# Patient Record
Sex: Male | Born: 1985 | Race: White | Hispanic: No | Marital: Married | Smoking: Current every day smoker
Health system: Southern US, Community
[De-identification: ages and names within clinical notes are randomized; demographics above are authoritative.]

---

## 2020-01-12 ENCOUNTER — Emergency Department (HOSPITAL_COMMUNITY)
Admission: EM | Admit: 2020-01-12 | Discharge: 2020-01-12 | Discharge status: Against Medical Advice | Payer: Self-pay | Attending: Emergency Medicine | Admitting: Emergency Medicine

## 2020-01-12 ENCOUNTER — Other Ambulatory Visit: Payer: Self-pay

## 2020-01-12 ENCOUNTER — Encounter (HOSPITAL_COMMUNITY): Payer: Self-pay | Admitting: *Deleted

## 2020-01-12 ENCOUNTER — Emergency Department (HOSPITAL_COMMUNITY): Payer: Self-pay

## 2020-01-12 DIAGNOSIS — L0390 Cellulitis, unspecified: Secondary | ICD-10-CM

## 2020-01-12 DIAGNOSIS — F17210 Nicotine dependence, cigarettes, uncomplicated: Secondary | ICD-10-CM | POA: Insufficient documentation

## 2020-01-12 DIAGNOSIS — L03113 Cellulitis of right upper limb: Secondary | ICD-10-CM | POA: Insufficient documentation

## 2020-01-12 DIAGNOSIS — F1910 Other psychoactive substance abuse, uncomplicated: Secondary | ICD-10-CM | POA: Insufficient documentation

## 2020-01-12 LAB — CBC WITH DIFFERENTIAL/PLATELET
Abs Immature Granulocytes: 0.04 10*3/uL (ref 0.00–0.07)
Basophils Absolute: 0 10*3/uL (ref 0.0–0.1)
Basophils Relative: 0 %
Eosinophils Absolute: 0.3 10*3/uL (ref 0.0–0.5)
Eosinophils Relative: 3 %
HCT: 45.8 % (ref 39.0–52.0)
Hemoglobin: 15.3 g/dL (ref 13.0–17.0)
Immature Granulocytes: 0 %
Lymphocytes Relative: 19 %
Lymphs Abs: 2 10*3/uL (ref 0.7–4.0)
MCH: 29.6 pg (ref 26.0–34.0)
MCHC: 33.4 g/dL (ref 30.0–36.0)
MCV: 88.6 fL (ref 80.0–100.0)
Monocytes Absolute: 0.9 10*3/uL (ref 0.1–1.0)
Monocytes Relative: 8 %
Neutro Abs: 7.4 10*3/uL (ref 1.7–7.7)
Neutrophils Relative %: 70 %
Platelets: 184 10*3/uL (ref 150–400)
RBC: 5.17 MIL/uL (ref 4.22–5.81)
RDW: 14 % (ref 11.5–15.5)
WBC: 10.6 10*3/uL — ABNORMAL HIGH (ref 4.0–10.5)
nRBC: 0 % (ref 0.0–0.2)

## 2020-01-12 LAB — BASIC METABOLIC PANEL
Anion gap: 8 (ref 5–15)
BUN: 9 mg/dL (ref 6–20)
CO2: 30 mmol/L (ref 22–32)
Calcium: 8.5 mg/dL — ABNORMAL LOW (ref 8.9–10.3)
Chloride: 95 mmol/L — ABNORMAL LOW (ref 98–111)
Creatinine, Ser: 0.66 mg/dL (ref 0.61–1.24)
GFR calc Af Amer: >60 mL/min (ref >60)
GFR calc non Af Amer: >60 mL/min (ref >60)
Glucose, Bld: 121 mg/dL — ABNORMAL HIGH (ref 70–99)
Potassium: 3.7 mmol/L (ref 3.5–5.1)
Sodium: 133 mmol/L — ABNORMAL LOW (ref 135–145)

## 2020-01-12 MED ORDER — CLINDAMYCIN PHOSPHATE 900 MG/50ML IV SOLN
900.0000 mg | Freq: Once | INTRAVENOUS | Status: AC
  Administered 2020-01-12: 06:00:00 900 mg via INTRAVENOUS
  Filled 2020-01-12: qty 50

## 2020-01-12 MED ORDER — DOXYCYCLINE HYCLATE 100 MG PO CAPS: 100.0000 mg | ORAL_CAPSULE | Freq: Two times a day (BID) | ORAL | 0 refills | Status: DC

## 2020-01-12 MED ORDER — DOXYCYCLINE HYCLATE 100 MG PO CAPS: 100.0000 mg | ORAL_CAPSULE | Freq: Two times a day (BID) | ORAL | 0 refills | Status: AC

## 2020-01-12 MED ORDER — IOHEXOL 300 MG/ML  SOLN
100.0000 mL | Freq: Once | INTRAMUSCULAR | Status: AC | PRN
  Administered 2020-01-12: 100 mL via INTRAVENOUS

## 2020-01-12 MED ORDER — KETOROLAC TROMETHAMINE 30 MG/ML IJ SOLN
30.0000 mg | Freq: Once | INTRAMUSCULAR | Status: AC
  Administered 2020-01-12: 30 mg via INTRAVENOUS
  Filled 2020-01-12: qty 1

## 2020-01-12 NOTE — Discharge Instructions (Addendum)
You are leaving the emergency department AGAINST MEDICAL ADVICE.  We discussed that we are concerned you have a serious infection of the arm but could cause you to lose the arm or die.  You have agreed to come back at a later time today for completion of management.  It is imperative that you do so.

## 2020-01-12 NOTE — ED Provider Notes (Signed)
[facility] EMERGENCY DEPARTMENT Provider Note   CSN: [id number] Arrival date & time: 01/12/20  0436  Time seen 5:00 AM  History Chief Complaint  Patient presents with  . Arm Pain    Luke Chaney is a 34 y.o. male.  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  Patient is right-handed.  He relates about 1 AM yesterday morning he noticed some redness on his right forearm and his girlfriend marked in ink.  He reports increasing redness and swelling and pain in his arm over the past 24 hours.  He denies fever, chills, chest pain, or shortness of breath.  Patient states he went to [facility] last night and had an x-ray done but he left because he had been in the waiting room for 3 hours.  PCP Patient, No Pcp Per   History reviewed. No pertinent past medical history.  There are no problems to display for this patient.   History reviewed. No pertinent surgical history.     History reviewed. No pertinent family history.  Social History   Tobacco Use  . Smoking status: Current Every Day Smoker    Packs/day: 1.00    Types: Cigarettes  . Smokeless tobacco: Never Used  Substance Use Topics  . Alcohol use: Not Currently  . Drug use: Yes    Types: Methamphetamines, Marijuana    Comment: heroin  unemployed  Home Medications Prior to Admission medications   Medication Sig Start Date End Date Taking? Authorizing Provider  doxycycline (VIBRAMYCIN) 100 MG capsule Take 1 capsule (100 mg total) by mouth 2 (two) times daily. 01/12/20   Devoria Albe, MD    Allergies    Patient has no known allergies.  Review of Systems   Review of Systems  All other systems reviewed and are negative.   Physical Exam Updated Vital Signs BP 137/83 (BP Location: Left Arm)   Pulse (!) 115   Temp 99.2 F (37.3 C) (Oral)   Resp 18   Ht 5\' 11"  (1.803 m)   Wt 68  kg   SpO2 100%   BMI 20.92 kg/m   Physical Exam Vitals and nursing note reviewed.  Constitutional:      Appearance: Normal appearance. He is normal weight.  HENT:     Head: Normocephalic and atraumatic.     Right Ear: External ear normal.     Left Ear: External ear normal.     Nose: Nose normal.  Eyes:     Extraocular Movements: Extraocular movements intact.     Conjunctiva/sclera: Conjunctivae normal.     Pupils: Pupils are equal, round, and reactive to light.  Cardiovascular:     Rate and Rhythm: Regular rhythm. Tachycardia present.     Pulses: Normal pulses.     Comments: Loud S2 Pulmonary:     Effort: Pulmonary effort is normal. No respiratory distress.     Breath sounds: Normal breath sounds.  Musculoskeletal:        General: Swelling and tenderness present.     Cervical back: Normal range of motion.     Comments: Patient has a large area of redness and swelling on the volar aspect of his mid right forearm with a lot of tenderness to even light palpation along the scar tissue where he is injected along that vein.  He is noted to have several  red streaking that extends from the elbow superiorly.  There may be some small lymph nodes in the right axilla.  Skin:    General: Skin is warm and dry.     Capillary Refill: Capillary refill takes less than 2 seconds.     Findings: Erythema present.  Neurological:     General: No focal deficit present.     Mental Status: He is alert and oriented to person, place, and time.     Cranial Nerves: No cranial nerve deficit.  Psychiatric:        Mood and Affect: Mood normal.        Behavior: Behavior normal.        Thought Content: Thought content normal.           ED Results / Procedures / Treatments   Labs (all labs ordered are listed, but only abnormal results are displayed) Results for orders placed or performed during the hospital encounter of 01/12/20  Culture, blood (routine x 2)   Specimen: Left Antecubital; Blood    Result Value Ref Range   Specimen Description LEFT ANTECUBITAL    Special Requests      BOTTLES DRAWN AEROBIC AND ANAEROBIC Blood Culture adequate volume Performed at [facility], [address], [place], [place] [address]    Culture PENDING    Report Status PENDING   CBC with Differential  Result Value Ref Range   WBC 10.6 (H) 4.0 - 10.5 K/uL   RBC 5.17 4.22 - 5.81 MIL/uL   Hemoglobin 15.3 13.0 - 17.0 g/dL   HCT 45.8 39.0 - 52.0 %   MCV 88.6 80.0 - 100.0 fL   MCH 29.6 26.0 - 34.0 pg   MCHC 33.4 30.0 - 36.0 g/dL   RDW 14.0 11.5 - 15.5 %   Platelets 184 150 - 400 K/uL   nRBC 0.0 0.0 - 0.2 %   Neutrophils Relative % 70 %   Neutro Abs 7.4 1.7 - 7.7 K/uL   Lymphocytes Relative 19 %   Lymphs Abs 2.0 0.7 - 4.0 K/uL   Monocytes Relative 8 %   Monocytes Absolute 0.9 0.1 - 1.0 K/uL   Eosinophils Relative 3 %   Eosinophils Absolute 0.3 0.0 - 0.5 K/uL   Basophils Relative 0 %   Basophils Absolute 0.0 0.0 - 0.1 K/uL   Immature Granulocytes 0 %   Abs Immature Granulocytes 0.04 0.00 - 0.07 K/uL  Basic metabolic panel  Result Value Ref Range   Sodium 133 (L) 135 - 145 mmol/L   Potassium 3.7 3.5 - 5.1 mmol/L   Chloride 95 (L) 98 - 111 mmol/L   CO2 30 22 - 32 mmol/L   Glucose, Bld 121 (H) 70 - 99 mg/dL   BUN 9 6 - 20 mg/dL   Creatinine, Ser 0.66 0.61 - 1.24 mg/dL   Calcium 8.5 (L) 8.9 - 10.3 mg/dL   GFR calc non Af Amer >60 >60 mL/min   GFR calc Af Amer >60 >60 mL/min   Anion gap 8 5 - 15     Laboratory interpretation all normal except mild leukocytosis, nonfasting hyperglycemia, mild hyponatremia   EKG None  Radiology No results found.   X-[facility] from [facility] from last night     Procedures Procedures (including critical care time)  Medications Ordered in ED Medications  clindamycin (CLEOCIN) IVPB 900 mg (0 mg Intravenous Stopped 01/12/20 0632)  ketorolac (TORADOL) 30 MG/ML injection 30 mg (30 mg Intravenous Given 01/12/20 0609)  iohexol (OMNIPAQUE) 300 MG/ML  solution  100 mL (100 mLs Intravenous Contrast Given 01/12/20 6063)    ED Course  I have reviewed the triage vital signs and the nursing notes.  Pertinent labs & imaging results that were available during my care of the patient were reviewed by me and considered in my medical decision making (see chart for details).    MDM Rules/Calculators/A&P                       Patient has made it clear to me that he cannot stay, he has to be out of his hotel room by tomorrow morning and all the belongings he owns are in there.  He states he has no one to help him.  He did agree to the lab work and he was given IV clindamycin for suspected MRSA cellulitis and possible abscess.  Patient had a CT scan done of his forearm.  Patient left at change of shift with T. Triplett, PA to get results of his CT scan.  Patient may need I&D.  Final Clinical Impression(s) / ED Diagnoses Final diagnoses:  Cellulitis of right forearm  IV drug abuse (HCC)    Rx / DC Orders ED Discharge Orders         Ordered    doxycycline (VIBRAMYCIN) 100 MG capsule  2 times daily,   Status:  Discontinued     01/12/20 0724    doxycycline (VIBRAMYCIN) 100 MG capsule  2 times daily     01/12/20 0725          Plan discharge  Devoria Albe, MD, Concha Pyo, MD 01/12/20 [phone]

## 2020-01-12 NOTE — ED Notes (Signed)
Lab went in to draw 2nd set of blood cultures, pt stated "do yall really need another IV?" this nurse stated were not starting another IV we need more blood cultures and lab work. Pt states " I dont want to be stuck anymore." MD notified

## 2020-01-12 NOTE — ED Provider Notes (Signed)
  Provider Note MRN:  [id number]  Arrival date & time: 01/12/20    ED Course and Medical Decision Making  Assumed care from Dr. Lynelle Doctor at shift change.  IV drug use with arm infection, question abscess or deeper space infection.  Patient explains that he needs to go tend to a personal matter and cannot stay.  Explained that we are concerned for infection and he may become very sick or lose the arm or die.  He promises to return later this afternoon once he manages his affairs.  Received single dose antibiotics here, explained that he needs antibiotics and possibly a procedure as well.  Agrees to come back later.  Patient fully aware of the risks of leaving AGAINST MEDICAL ADVICE.  Procedures  Final Clinical Impressions(s) / ED Diagnoses     ICD-10-CM   1. Cellulitis of right forearm  L03.113   2. Cellulitis, unspecified cellulitis site  L03.90 CT FOREARM RIGHT W CONTRAST    CT FOREARM RIGHT W CONTRAST  3. IV drug abuse (HCC)  F19.10     ED Discharge Orders         Ordered    doxycycline (VIBRAMYCIN) 100 MG capsule  2 times daily,   Status:  Discontinued     01/12/20 0724    doxycycline (VIBRAMYCIN) 100 MG capsule  2 times daily     01/12/20 0725            Discharge Instructions     You are leaving the emergency department AGAINST MEDICAL ADVICE.  We discussed that we are concerned you have a serious infection of the arm but could cause you to lose the arm or die.  You have agreed to come back at a later time today for completion of management.  It is imperative that you do so.    Elmer Sow. Pilar Plate, MD [facility] Emergency [facility] [email or web address]    Sabas Sous, MD 01/12/20 [phone]

## 2020-01-12 NOTE — ED Triage Notes (Signed)
Pt states he was doing a shot of heroin on Monday morning and he missed the vein; pt states his arm starting getting more red and swollen throughout the day; pt states his girlfriend marked around the red area Tuesday at 1am; today he comes in and the area has spread more outside the marked area and is more painful

## 2020-01-17 LAB — CULTURE, BLOOD (ROUTINE X 2)
Culture: NO GROWTH
Special Requests: ADEQUATE

## 2021-03-01 IMAGING — CT CT FOREARM*R* W/CM
3 of 4 series · 7 of 33 positions shown, 9 images · IV contrast (agent unspecified)
Comparison: None.

CONTRAST:  100mL OMNIPAQUE IOHEXOL 300 MG/ML  SOLN

CLINICAL DATA: IV drug abuse. Pain, swelling and redness of the
right upper extremity.

EXAM:
CT OF THE UPPER RIGHT EXTREMITY WITH CONTRAST
TECHNIQUE: Multidetector CT imaging of the upper right extremity was performed
according to the standard protocol following intravenous contrast
administration.

[Series 6: axial st · coronal · 0.32mm/px · 1 of 75 slices shown]
[im 38/75  bone]
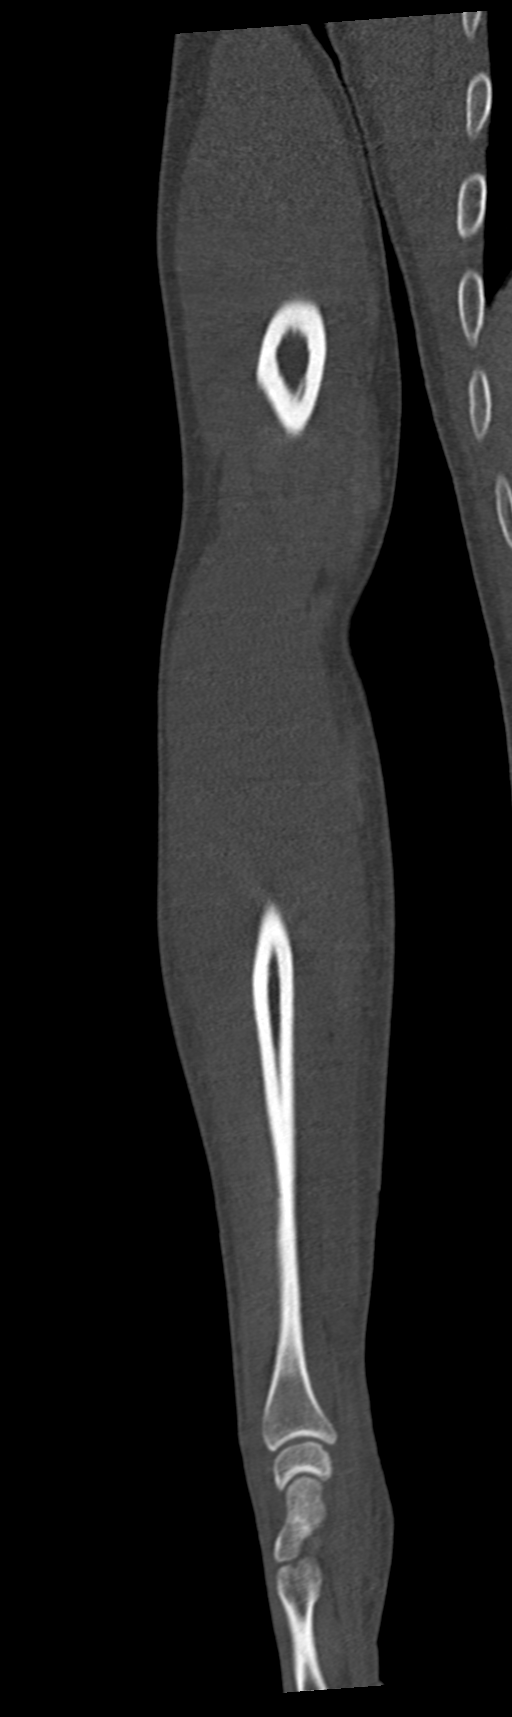

[Series 10: sag st · sagittal · 0.41mm/px · 5 of 97 slices shown, 6 images]
[im 33/97  bone]
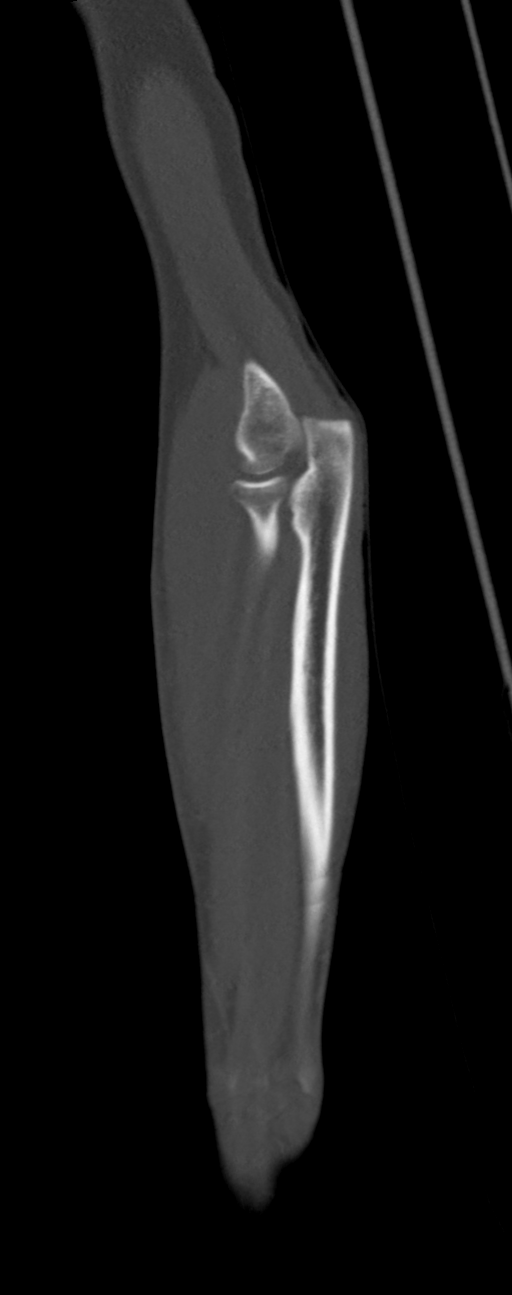
[im 41/97  bone]
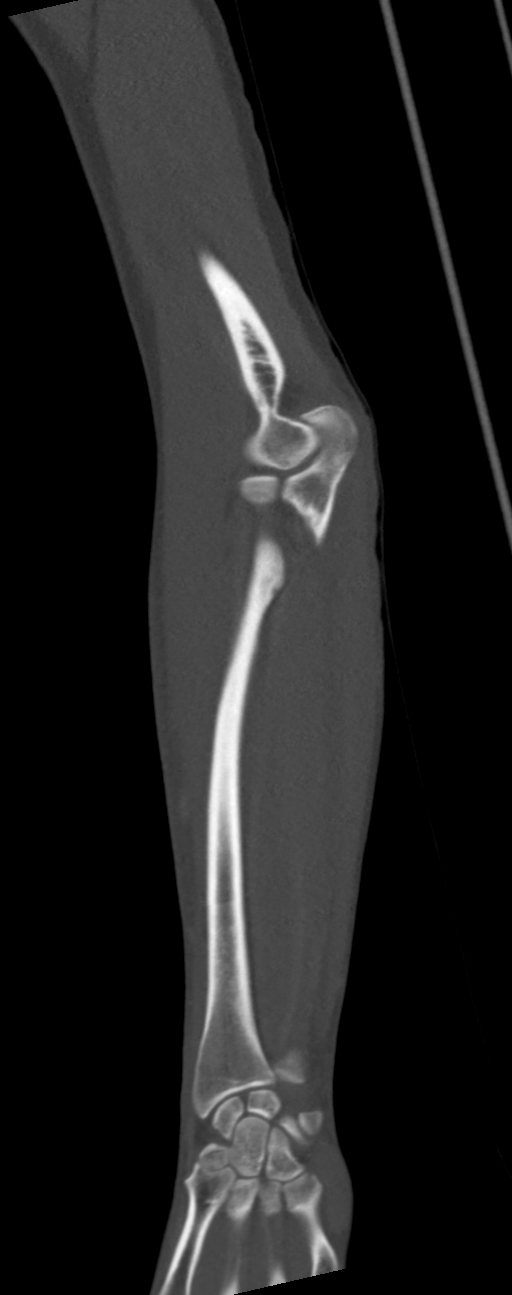
[im 49/97  soft-tissue]
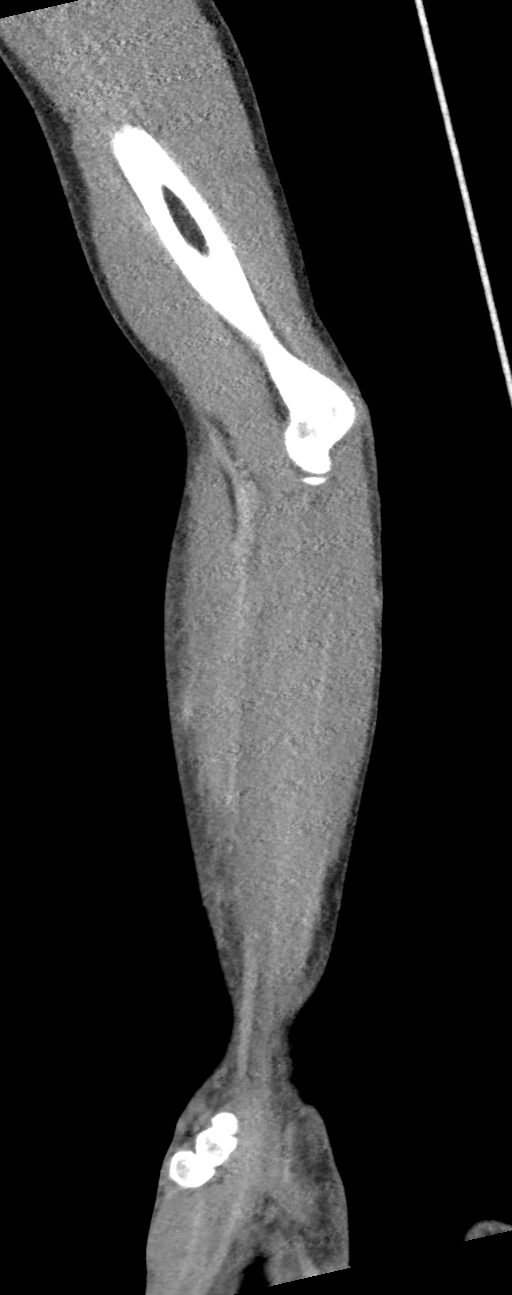
[im 49/97  bone]
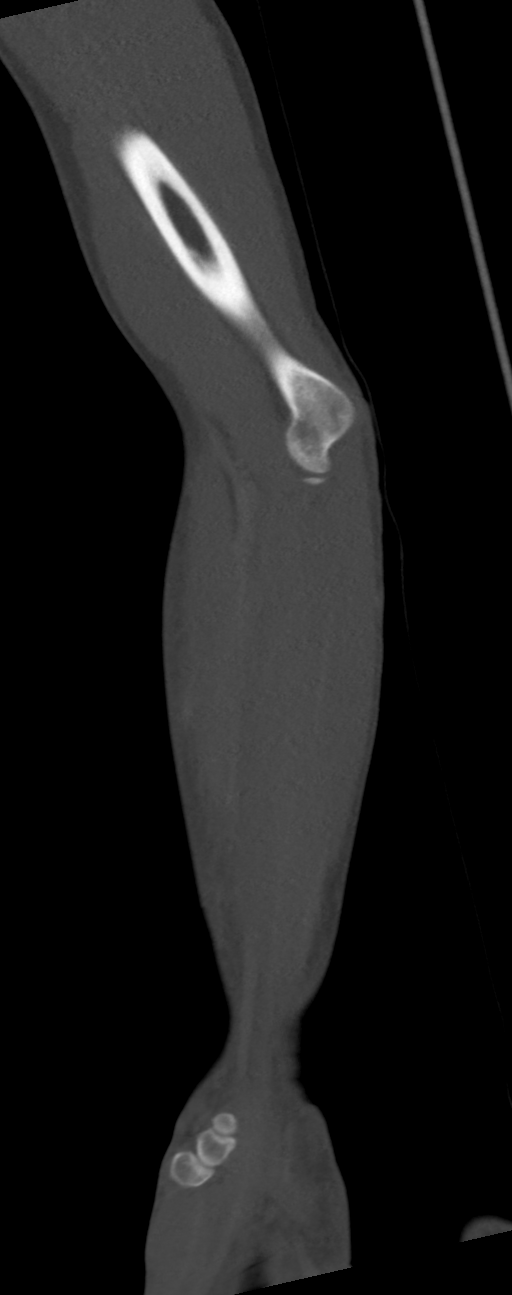
[im 57/97  bone]
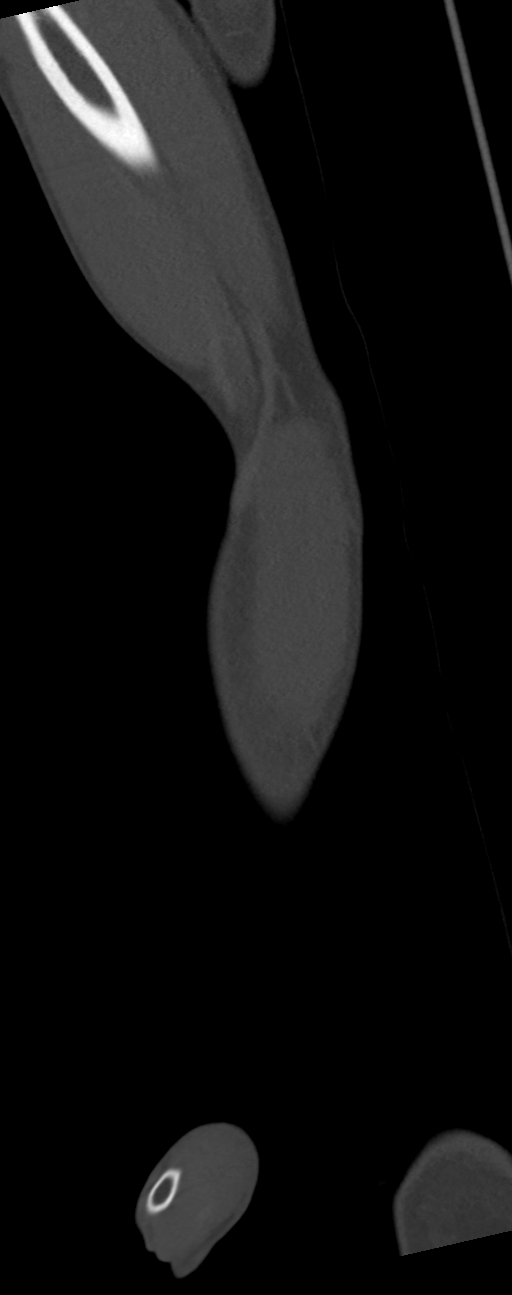
[im 65/97  bone]
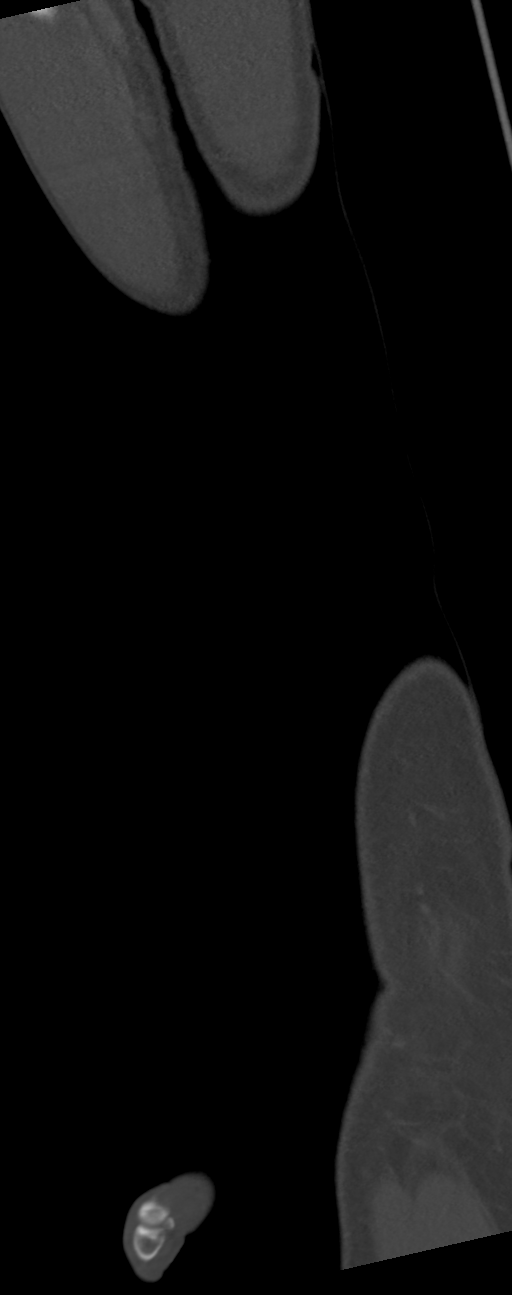

[Series 11: axial bone · axial · 0.32mm/px · z∈[-724,-724]mm · 1 of 264 slices shown, 2 images]
[im 142/264  soft-tissue]
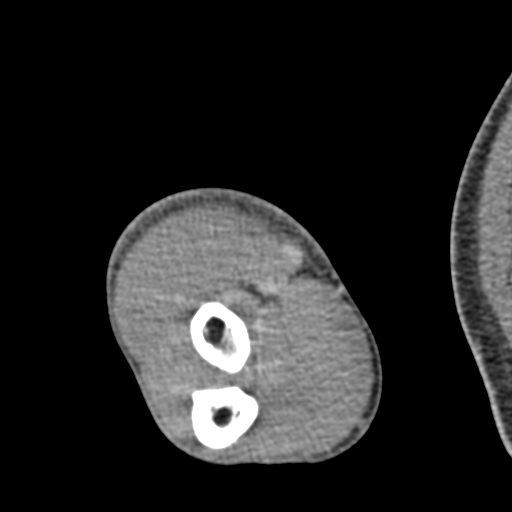
[im 142/264  bone]
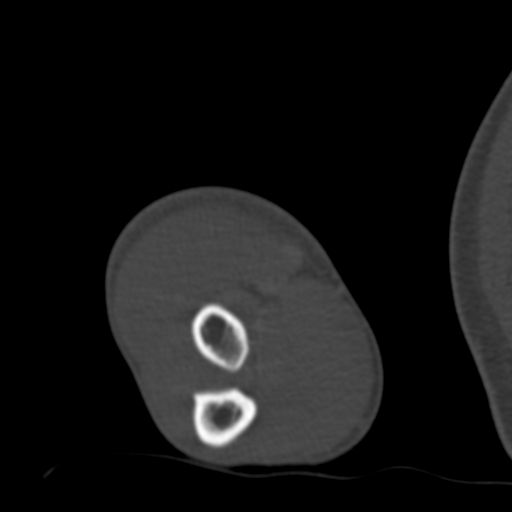

[7 of 33 positions shown; findings below may reference images not displayed]

FINDINGS: Diffuse subcutaneous soft tissue swelling/edema consistent with
cellulitis. No rim enhancing fluid collection to suggest a drainable
soft tissue abscess.

Suspect diffuse myositis but no definite CT findings for
pyomyositis. The elbow and wrist joints are maintained. No findings
suspicious for septic arthritis. No destructive bony changes to
suggest osteomyelitis.
IMPRESSION: 1. Diffuse subcutaneous soft tissue swelling/edema consistent with
cellulitis. No focal soft tissue abscess is identified.
2. Suspect diffuse myositis but no definite CT findings for
pyomyositis.
3. No CT findings for septic arthritis or osteomyelitis.
